# Patient Record
Sex: Male | Born: 2000 | Race: White | Hispanic: No | Marital: Single
Health system: Southern US, Community
[De-identification: ages and names within clinical notes are randomized; demographics above are authoritative.]

## PROBLEM LIST (undated history)

## (undated) DIAGNOSIS — Z789 Other specified health status: Secondary | ICD-10-CM

## (undated) DIAGNOSIS — F913 Oppositional defiant disorder: Secondary | ICD-10-CM

## (undated) DIAGNOSIS — F331 Major depressive disorder, recurrent, moderate: Secondary | ICD-10-CM

## (undated) DIAGNOSIS — F909 Attention-deficit hyperactivity disorder, unspecified type: Secondary | ICD-10-CM

## (undated) DIAGNOSIS — R030 Elevated blood-pressure reading, without diagnosis of hypertension: Secondary | ICD-10-CM

## (undated) HISTORY — PX: NO PAST SURGERIES: SHX2092

## (undated) HISTORY — DX: Attention-deficit hyperactivity disorder, unspecified type: F90.9

## (undated) HISTORY — DX: Major depressive disorder, recurrent, moderate: F33.1

---
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
## 2020-01-12 ENCOUNTER — Telehealth: Payer: Self-pay | Admitting: Pediatrics

## 2020-01-12 NOTE — Telephone Encounter (Signed)
Pre-screening for onsite visit ° °1. Who is bringing the patient to the visit? Patient  ° °Informed only one adult can bring patient to the visit to limit possible exposure to COVID19 and facemasks must be worn while in the building by the patient (ages 2 and older) and adult. ° °2. Has the person bringing the patient or the patient been around anyone with suspected or confirmed COVID-19 in the last 14 days? No ° °3. Has the person bringing the patient or the patient been around anyone who has been tested for COVID-19 in the last 14 days? {No ° °4. Has the person bringing the patient or the patient had any of these symptoms in the last 14 days? No  ° °Fever (temp 100 F or higher) °Breathing problems °Cough °Sore throat °Body aches °Chills °Vomiting °Diarrhea °Loss of taste or smell ° ° °If all answers are negative, advise patient to call our office prior to your appointment if you or the patient develop any of the symptoms listed above. °  °If any answers are yes, cancel in-office visit and schedule the patient for a same day telehealth visit with a provider to discuss the next steps. °

## 2020-01-13 ENCOUNTER — Encounter: Payer: Self-pay | Admitting: Pediatrics

## 2020-01-13 ENCOUNTER — Ambulatory Visit (INDEPENDENT_AMBULATORY_CARE_PROVIDER_SITE_OTHER): Payer: Medicaid Other | Admitting: Pediatrics

## 2020-01-13 ENCOUNTER — Other Ambulatory Visit: Payer: Self-pay

## 2020-01-13 VITALS — BP 138/68 | HR 97 | Ht 74.21 in | Wt 277.0 lb

## 2020-01-13 DIAGNOSIS — F902 Attention-deficit hyperactivity disorder, combined type: Secondary | ICD-10-CM | POA: Diagnosis not present

## 2020-01-13 DIAGNOSIS — J302 Other seasonal allergic rhinitis: Secondary | ICD-10-CM | POA: Diagnosis not present

## 2020-01-13 DIAGNOSIS — R030 Elevated blood-pressure reading, without diagnosis of hypertension: Secondary | ICD-10-CM | POA: Diagnosis not present

## 2020-01-13 MED ORDER — CETIRIZINE HCL 10 MG PO TABS: 10.0000 mg | ORAL_TABLET | Freq: Every day | ORAL | 11 refills | Status: AC

## 2020-01-13 MED ORDER — FLUTICASONE PROPIONATE 50 MCG/ACT NA SUSP: 1.0000 | Freq: Every day | NASAL | 12 refills | Status: AC

## 2020-01-13 MED ORDER — AMPHETAMINE-DEXTROAMPHETAMINE 20 MG PO TABS: 20.0000 mg | ORAL_TABLET | Freq: Every day | ORAL | 0 refills | Status: AC

## 2020-01-13 MED ORDER — AMPHETAMINE-DEXTROAMPHET ER 30 MG PO CP24: 30.0000 mg | ORAL_CAPSULE | Freq: Every day | ORAL | 0 refills | Status: AC

## 2020-01-13 NOTE — Patient Instructions (Signed)
Measure your blood pressure at home at least once a week - before taking your Adderall and a few hours after taking your Adderall.  Send me a MyChart message with your BP measurements.

## 2020-01-13 NOTE — Progress Notes (Signed)
Subjective:    Bryce Miles is a 19 y.o. old male here for Follow-up ADHD, elevated BP, and Allergies (seasonal) .    HPI ADHD - Taking Adderall XR 30 mg in the mornings, but didn't take this morning.  Usually takes this when he wakes up around 7 PM. Takes the 20 mg dose at 2:30 AM on his break.  This is helping him stay focused and on task for work and school.  No bothersome side effects.    He is working 11 PM - 7 AM at [employer] and [employer] - started last week.  Hoping to switch to day shift soon.  Doing online school work in the evenings before work.  He is 12th grade and is on track to graduate this spring.  He is sleeping from 9 AM to 6 PM.   Elevated BP - Working out with his brother at the gym about 5 times per week.  Eating healthier, more fruits and vegetables but still eating out (sushi or [place] food) 4-5 times per week.   His girlfriend in pregnant at due in May. He occasionally vapes with nicotine products.    Allergies - Taking cetirizine daily which helps, but he hasn't taken it yet today.    Acne - Not using the cream that was prescribed.  Not interested in medication for his acne currently.    Review of Systems  History and Problem List: Bryce Miles has Acne, mild; Attention deficit hyperactivity disorder (ADHD), combined type; Seasonal allergic rhinitis; and Obesity due to excess calories with body mass index (BMI) in 95th to 98th percentile for age in pediatric patient on their problem list.  Bryce Miles  has a past medical history of ADHD (attention deficit hyperactivity disorder), Elevated blood pressure reading (09/20/2017), MDD (major depressive disorder), recurrent episode, moderate (HCC) (07/09/2013), Medical history non-contributory, and Oppositional defiant disorder (07/09/2013).     Objective:    BP 138/68 (BP Location: Right Arm, Patient Position: Sitting, Cuff Size: Large)   Pulse 97   Ht 6' 2.21" (1.885 m)   Wt 277 lb (125.6 kg)   BMI 35.36 kg/m  Physical Exam Vitals  reviewed.  Constitutional:      General: He is not in acute distress.    Appearance: He is not ill-appearing.     Comments: Intermittent sneezing and rubbing at his nose.  HENT:     Nose: Congestion present.     Mouth/Throat:     Mouth: Mucous membranes are moist.  Eyes:     Conjunctiva/sclera: Conjunctivae normal.  Cardiovascular:     Rate and Rhythm: Normal rate and regular rhythm.     Heart sounds: Normal heart sounds.  Pulmonary:     Effort: Pulmonary effort is normal.     Breath sounds: Normal breath sounds.  Skin:    General: Skin is warm and dry.  Neurological:     General: No focal deficit present.     Mental Status: He is alert and oriented to person, place, and time.        Assessment and Plan:   Bryce Miles is a 19 y.o. old male with  1. Seasonal allergic rhinitis, unspecified trigger Refilled cetirizine and Rx for flonase to use also if needed.   - cetirizine (ZYRTEC) 10 MG tablet; Take 1 tablet (10 mg total) by mouth daily.  Dispense: 30 tablet; Refill: 11 - fluticasone (FLONASE) 50 MCG/ACT nasal spray; Place 1-2 sprays into both nostrils daily. For allergy symptoms  Dispense: 16 g; Refill: 12  2. Attention deficit hyperactivity disorder (ADHD), combined type Doing well on current Rx.  BP is up today, but he has not taken his Adderall today.  Continue to monitor BP closely.   - amphetamine-dextroamphetamine (ADDERALL XR) 30 MG 24 hr capsule; Take 1 capsule (30 mg total) by mouth daily with breakfast.  Dispense: 30 capsule; Refill: 0 - amphetamine-dextroamphetamine (ADDERALL) 20 MG tablet; Take 1 tablet (20 mg total) by mouth daily. Mid day.  Dispense: 30 tablet; Refill: 0  3. Elevated blood pressure reading BP is in the stage 1 hypertension today.  He has not taken his Adderall today.  Recommend stopping/reducing vaping and eating out.  Recommend switching to day shift when able.  Check BP at home at least once a week and send via MyChart.  Recheck in office in 1  month, if still elevated will need to obtain hypertension labs and consider starting medication.      Return for onsite ADHD and BP recheck in 4 weeks with Dr. Doneen Poisson.  Carmie End, MD

## 2020-02-09 ENCOUNTER — Telehealth: Payer: Self-pay | Admitting: Pediatrics

## 2020-02-09 NOTE — Telephone Encounter (Signed)
Pre-screening for onsite visit  1. Who is bringing the patient to the visit? Self  Informed only one adult can bring patient to the visit to limit possible exposure to COVID19 and facemasks must be worn while in the building by the patient (ages 2 and older) and adult.  2. Has the person bringing the patient or the patient been around anyone with suspected or confirmed COVID-19 in the last 14 days? No  3. Has the person bringing the patient or the patient been around anyone who has been tested for COVID-19 in the last 14 days? No  4. Has the person bringing the patient or the patient had any of these symptoms in the last 14 days? NO  Fever (temp 100 F or higher) Breathing problems Cough Sore throat Body aches Chills Vomiting Diarrhea Loss of taste or smell   If all answers are negative, advise patient to call our office prior to your appointment if you or the patient develop any of the symptoms listed above.   If any answers are yes, cancel in-office visit and schedule the patient for a same day telehealth visit with a provider to discuss the next steps.  

## 2020-02-10 ENCOUNTER — Ambulatory Visit: Payer: Medicaid Other | Admitting: Pediatrics
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
## 2021-12-26 ENCOUNTER — Telehealth: Payer: Self-pay

## 2021-12-26 DIAGNOSIS — R112 Nausea with vomiting, unspecified: Secondary | ICD-10-CM | POA: Diagnosis not present

## 2021-12-26 DIAGNOSIS — R42 Dizziness and giddiness: Secondary | ICD-10-CM | POA: Diagnosis not present

## 2021-12-26 DIAGNOSIS — K529 Noninfective gastroenteritis and colitis, unspecified: Secondary | ICD-10-CM | POA: Diagnosis not present

## 2021-12-26 NOTE — Telephone Encounter (Signed)
Transition Care Management Unsuccessful Follow-up Telephone Call ? ?Date of discharge and from where:  12/23/2021 from [facility] ? ?Attempts:  1st Attempt ? ?Reason for unsuccessful TCM follow-up call:  Left voice message ? ? ? ?

## 2021-12-28 NOTE — Telephone Encounter (Signed)
Transition Care Management Unsuccessful Follow-up Telephone Call ? ?Date of discharge and from where:  12/23/2021 from [facility] ? ?Attempts:  2nd Attempt ? ?Reason for unsuccessful TCM follow-up call:  Left voice message ? ? ? ?

## 2021-12-29 NOTE — Telephone Encounter (Signed)
Transition Care Management Unsuccessful Follow-up Telephone Call ? ?Date of discharge and from where:  12/23/2021 from [facility] ? ?Attempts:  3rd Attempt ? ?Reason for unsuccessful TCM follow-up call:  Unable to reach patient ? ? ? ?

## 2022-09-07 DIAGNOSIS — Z113 Encounter for screening for infections with a predominantly sexual mode of transmission: Secondary | ICD-10-CM | POA: Diagnosis not present

## 2022-09-07 DIAGNOSIS — F419 Anxiety disorder, unspecified: Secondary | ICD-10-CM | POA: Diagnosis not present

## 2022-09-07 DIAGNOSIS — F339 Major depressive disorder, recurrent, unspecified: Secondary | ICD-10-CM | POA: Diagnosis not present

## 2022-09-07 DIAGNOSIS — Z13220 Encounter for screening for lipoid disorders: Secondary | ICD-10-CM | POA: Diagnosis not present

## 2022-09-07 DIAGNOSIS — Z0000 Encounter for general adult medical examination without abnormal findings: Secondary | ICD-10-CM | POA: Diagnosis not present

## 2022-09-07 DIAGNOSIS — Z130 Encounter for screening for diseases of the blood and blood-forming organs and certain disorders involving the immune mechanism: Secondary | ICD-10-CM | POA: Diagnosis not present
